# Patient Record
Sex: Male | Born: 1985 | Race: Black or African American | Hispanic: No | Marital: Single | Smoking: Never smoker
Health system: Southern US, Community
[De-identification: ages and names within clinical notes are randomized; demographics above are authoritative.]

## PROBLEM LIST (undated history)

## (undated) DIAGNOSIS — A749 Chlamydial infection, unspecified: Secondary | ICD-10-CM

## (undated) HISTORY — DX: Chlamydial infection, unspecified: A74.9

---

## 2014-03-13 ENCOUNTER — Ambulatory Visit (INDEPENDENT_AMBULATORY_CARE_PROVIDER_SITE_OTHER): Payer: Federal, State, Local not specified - PPO | Admitting: Family Medicine

## 2014-03-13 VITALS — BP 130/86 | HR 60 | Temp 98.4°F | Resp 18 | Ht 67.25 in | Wt 159.6 lb

## 2014-03-13 DIAGNOSIS — Z0000 Encounter for general adult medical examination without abnormal findings: Secondary | ICD-10-CM

## 2014-03-13 DIAGNOSIS — Z13220 Encounter for screening for lipoid disorders: Secondary | ICD-10-CM

## 2014-03-13 DIAGNOSIS — Z113 Encounter for screening for infections with a predominantly sexual mode of transmission: Secondary | ICD-10-CM

## 2014-03-13 LAB — POCT CBC
Granulocyte percent: 68.8 %G (ref 37–80)
HCT, POC: 46.9 % (ref 43.5–53.7)
Hemoglobin: 15.4 g/dL (ref 14.1–18.1)
Lymph, poc: 1.5 (ref 0.6–3.4)
MCH, POC: 31.6 pg — AB (ref 27–31.2)
MCHC: 32.8 g/dL (ref 31.8–35.4)
MCV: 96.1 fL (ref 80–97)
MID (cbc): 0.4 (ref 0–0.9)
MPV: 8 fL (ref 0–99.8)
POC Granulocyte: 4.1 (ref 2–6.9)
POC LYMPH PERCENT: 24.8 % (ref 10–50)
POC MID %: 6.4 %M (ref 0–12)
Platelet Count, POC: 261 10*3/uL (ref 142–424)
RBC: 4.88 M/uL (ref 4.69–6.13)
RDW, POC: 12.6 %
WBC: 5.9 10*3/uL (ref 4.6–10.2)

## 2014-03-13 LAB — COMPREHENSIVE METABOLIC PANEL
CO2: 25 mEq/L (ref 19–32)
Calcium: 9.5 mg/dL (ref 8.4–10.5)
Chloride: 105 mEq/L (ref 96–112)
Glucose, Bld: 82 mg/dL (ref 70–99)
Sodium: 140 mEq/L (ref 135–145)
Total Bilirubin: 1.2 mg/dL (ref 0.2–1.2)
Total Protein: 6.9 g/dL (ref 6.0–8.3)

## 2014-03-13 LAB — TSH: TSH: 1.435 u[IU]/mL (ref 0.350–4.500)

## 2014-03-13 LAB — HEPATITIS B SURFACE ANTIBODY, QUANTITATIVE: Hep B S AB Quant (Post): 146 m[IU]/mL

## 2014-03-13 LAB — COMPREHENSIVE METABOLIC PANEL WITH GFR
ALT: 17 U/L (ref 0–53)
AST: 17 U/L (ref 0–37)
Albumin: 4.6 g/dL (ref 3.5–5.2)
Alkaline Phosphatase: 52 U/L (ref 39–117)
BUN: 11 mg/dL (ref 6–23)
Creat: 1.04 mg/dL (ref 0.50–1.35)
Potassium: 4.9 meq/L (ref 3.5–5.3)

## 2014-03-13 LAB — LIPID PANEL
Cholesterol: 150 mg/dL (ref 0–200)
HDL: 37 mg/dL — ABNORMAL LOW (ref >39)
LDL Cholesterol: 101 mg/dL — ABNORMAL HIGH (ref 0–99)
Total CHOL/HDL Ratio: 4.1 Ratio
Triglycerides: 60 mg/dL (ref <150)
VLDL: 12 mg/dL (ref 0–40)

## 2014-03-13 LAB — RPR

## 2014-03-13 LAB — HEPATITIS C ANTIBODY: HCV Ab: NEGATIVE

## 2014-03-13 LAB — HEPATITIS B SURFACE ANTIGEN: Hepatitis B Surface Ag: NEGATIVE

## 2014-03-13 NOTE — Progress Notes (Signed)
Chief Complaint:  Chief Complaint  Patient presents with  . Annual Exam    HPI: Matthew Murphy is a 28 y.o. male who is here for  PE for free [employer]. He is a Consulting civil engineerstudent at [employer].  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 Chlamydia     2008, treated   History reviewed. No pertinent past surgical history. History   Social History  . Marital Status: Single    Spouse Name: N/A    Number of Children: N/A  . Years of Education: N/A   Social History Main Topics  . Smoking status: Never Smoker   . Smokeless tobacco: None  . Alcohol Use: Yes     Comment: socially  . Drug Use: No  . Sexual Activity: Yes    Birth Control/ Protection: Condom   Other Topics Concern  . None   Social History Narrative  . None   History reviewed. No pertinent family history. No Known Allergies Prior to Admission medications   Not on File     ROS: The patient denies fevers, chills, night sweats, unintentional weight loss, chest pain, palpitations, wheezing, dyspnea on exertion, nausea, vomiting, abdominal pain, dysuria, hematuria, melena, numbness, weakness, or tingling.   All other systems have been reviewed and were otherwise negative with the exception of those mentioned in the HPI and as above.    PHYSICAL EXAM: Filed Vitals:   03/13/14 1223  BP: 130/86  Pulse: 60  Temp: 98.4 F (36.9 C)  Resp: 18   Filed Vitals:   03/13/14 1223  Height: 5' 7.25" (1.708 m)  Weight: 159 lb 9.6 oz (72.394 kg)   Body mass index is 24.82 kg/(m^2).  General: Alert, no acute distress HEENT:  Normocephalic, atraumatic, oropharynx patent. EOMI, PERRLA, fundo exam nl, TM nl, no exudates Cardiovascular:  Regular rate and rhythm, no rubs murmurs or gallops.  No Carotid  bruits, radial pulse intact. No pedal edema.  Respiratory: Clear to auscultation bilaterally.  No wheezes, rales, or rhonchi.  No cyanosis, no use of accessory musculature GI: No organomegaly, abdomen is soft and non-tender, positive bowel sounds.  No masses. Skin: No rashes. Neurologic: Facial musculature symmetric. Psychiatric: Patient is appropriate throughout our interaction. Lymphatic: No cervical lymphadenopathy Musculoskeletal: Gait intact. 5/5 strength, 2/2 DTRs   LABS: Results for orders placed in visit on 03/13/14  POCT CBC      Result Value Ref Range   WBC 5.9  4.6 - 10.2 K/uL   Lymph, poc 1.5  0.6 - 3.4   POC LYMPH PERCENT 24.8  10 - 50 %L   MID (cbc) 0.4  0 - 0.9   POC MID % 6.4  0 - 12 %M   POC Granulocyte 4.1  2 - 6.9   Granulocyte percent 68.8  37 - 80 %G   RBC 4.88  4.69 - 6.13 M/uL   Hemoglobin 15.4  14.1 - 18.1 g/dL   HCT, POC 13.046.9  86.543.5 - 53.7 %   MCV 96.1  80 - 97 fL   MCH, POC 31.6 (*) 27 - 31.2 pg   MCHC 32.8  31.8 - 35.4 g/dL   RDW, POC 78.412.6     Platelet Count, POC 261  142 - 424 K/uL   MPV 8.0  0 - 99.8 fL     EKG/XRAY:   [facility]   ASSESSMENT/PLAN: Encounter Diagnoses  Name Primary?  . Annual physical exam Yes  . Screening for STD (sexually transmitted disease)   . Screening for hyperlipidemia    Annual labs STD labs F/u prn otherwise in 1 year  Gross sideeffects, risk and benefits, and alternatives of medications d/w patient. Patient is aware that all medications have potential sideeffects and we are unable to predict every sideeffect or drug-drug interaction that may occur.  Lenell Antuhao P Esther Broyles, DO 03/13/2014 2:06 PM

## 2014-03-14 LAB — HIV ANTIBODY (ROUTINE TESTING W REFLEX): HIV 1&2 Ab, 4th Generation: NONREACTIVE

## 2014-03-15 LAB — GC/CHLAMYDIA PROBE AMP
CT Probe RNA: NEGATIVE
GC Probe RNA: NEGATIVE

## 2014-03-15 LAB — HSV(HERPES SIMPLEX VRS) I + II AB-IGG
HSV 1 Glycoprotein G Ab, IgG: 7.64 IV — ABNORMAL HIGH
HSV 2 Glycoprotein G Ab, IgG: <0.1 IV

## 2014-11-03 ENCOUNTER — Ambulatory Visit
Admission: RE | Admit: 2014-11-03 | Discharge: 2014-11-03 | Disposition: A | Discharge status: Home / Self Care | Payer: BC Managed Care – PPO | Source: Ambulatory Visit | Attending: Family Medicine | Admitting: Family Medicine

## 2014-11-03 ENCOUNTER — Other Ambulatory Visit: Payer: Self-pay | Admitting: Family Medicine

## 2014-11-03 DIAGNOSIS — S73101D Unspecified sprain of right hip, subsequent encounter: Secondary | ICD-10-CM

## 2016-02-01 IMAGING — CR DG FEMUR 2+V*R*
4 series · 4 of 4 positions shown · non-contrast
Comparison: None.

CLINICAL DATA: Right hip and proximal femur pain, injured playing
basketball

EXAM:
RIGHT FEMUR - 2 VIEW

[view not recorded (1 of 4)]
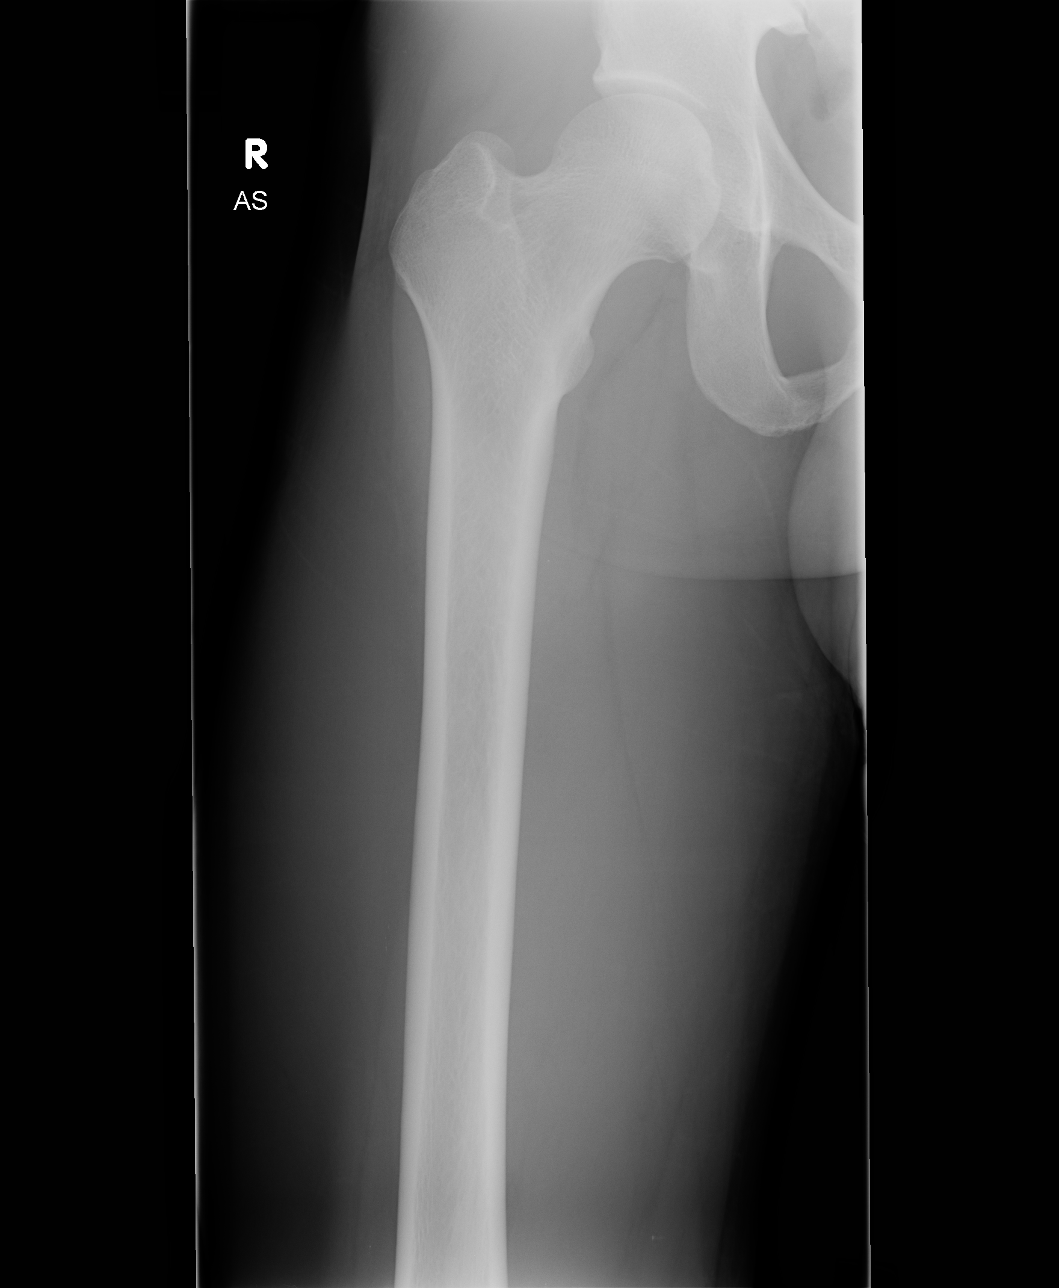

[view not recorded (2 of 4)]
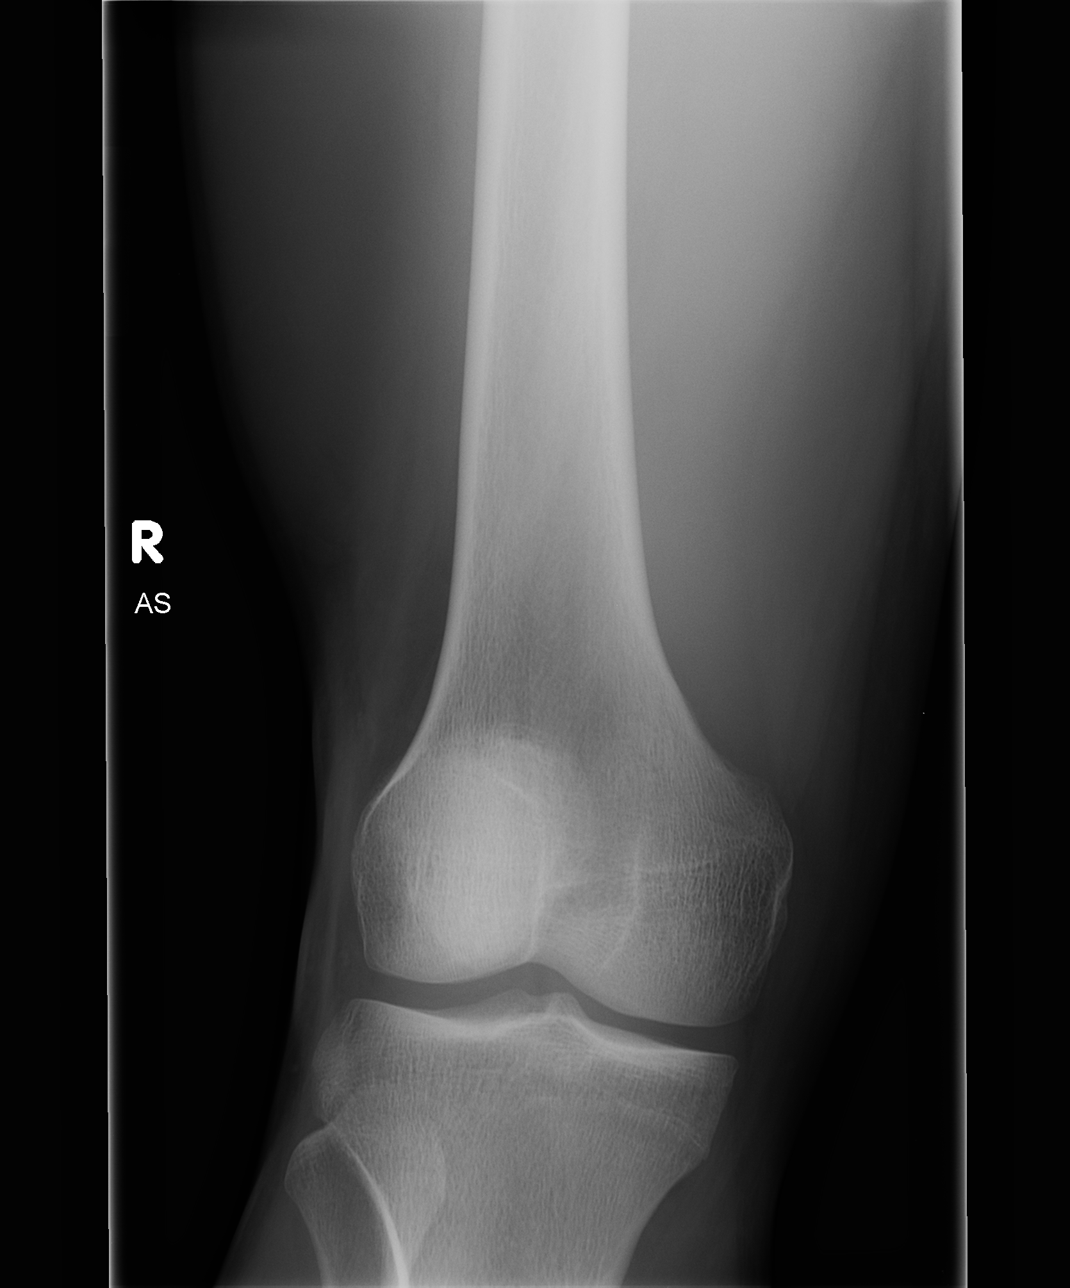

[view not recorded (3 of 4)]
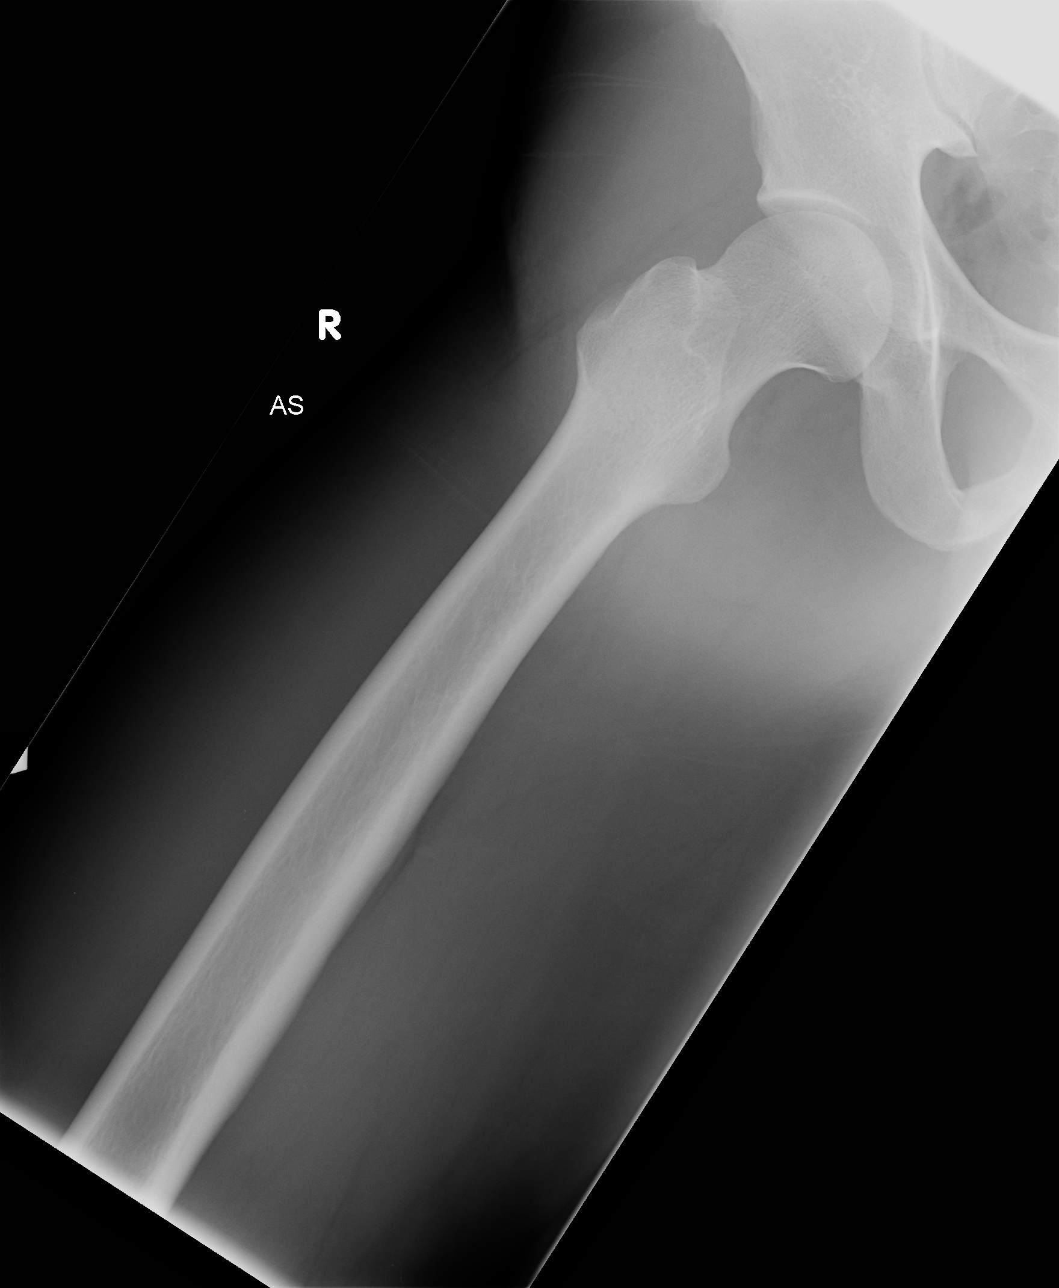

[view not recorded (4 of 4)]
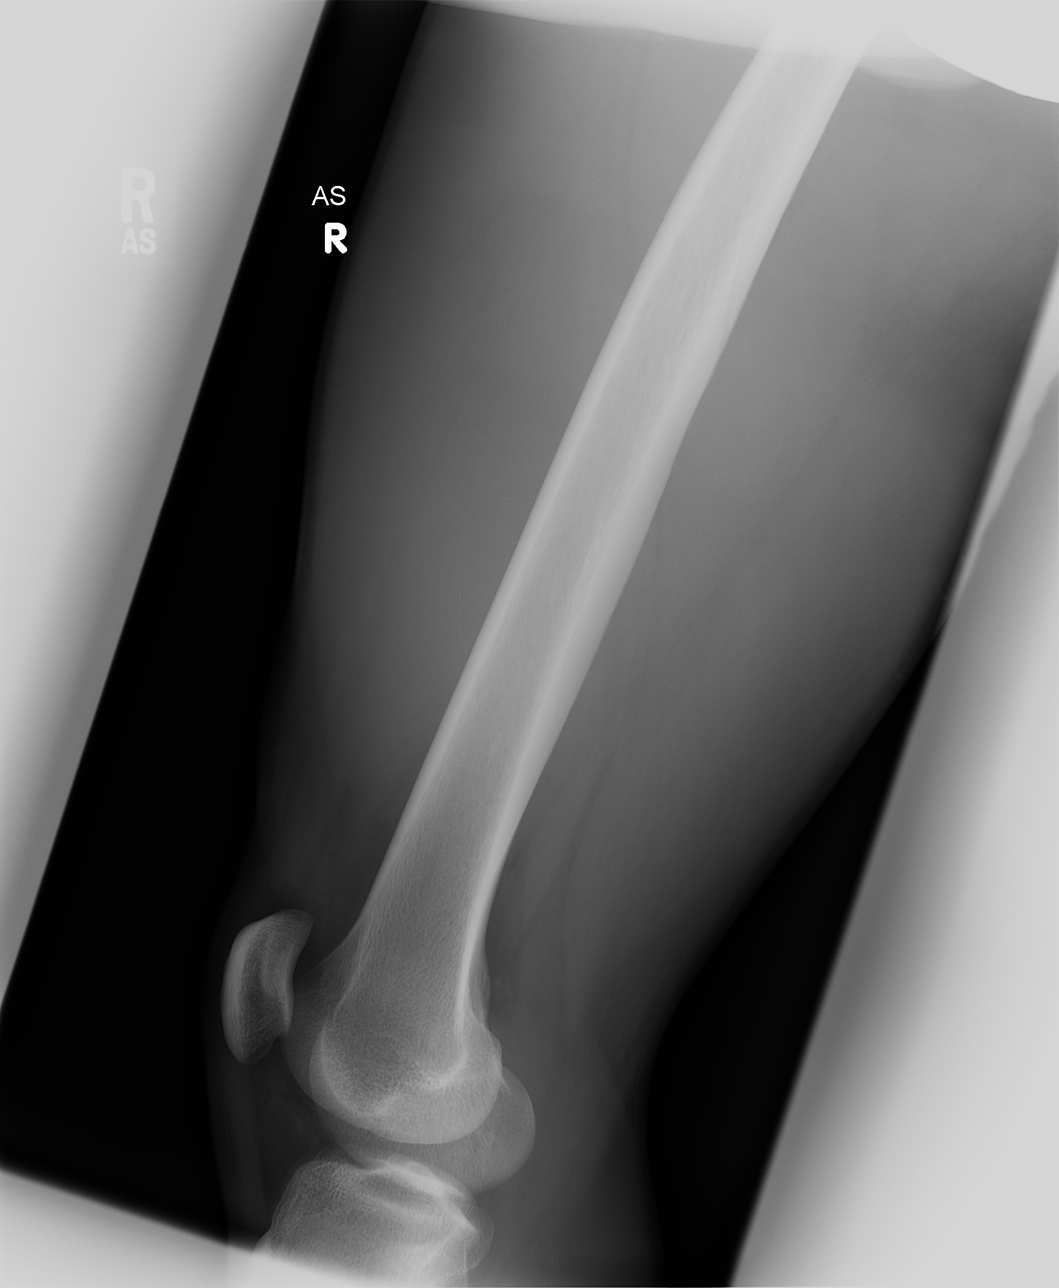

[4 of 4 positions shown; findings below may reference images not displayed]

FINDINGS: The right hip joint and the right femur are unremarkable. The right
knee joint appears normal on the images obtained. No bony
abnormality is seen.
IMPRESSION: Negative.
